# Patient Record
Sex: Male | Born: 1950 | Race: Black or African American | Hispanic: No | Smoking: Current every day smoker
Health system: Southern US, Community
[De-identification: ages and names within clinical notes are randomized; demographics above are authoritative.]

## PROBLEM LIST (undated history)

## (undated) DIAGNOSIS — M1990 Unspecified osteoarthritis, unspecified site: Secondary | ICD-10-CM

## (undated) DIAGNOSIS — E119 Type 2 diabetes mellitus without complications: Secondary | ICD-10-CM

## (undated) HISTORY — PX: BACK SURGERY: SHX140

## (undated) HISTORY — PX: SHOULDER SURGERY: SHX246

---

## 2020-03-14 ENCOUNTER — Other Ambulatory Visit: Payer: Self-pay | Admitting: Orthopedic Surgery

## 2020-03-14 DIAGNOSIS — S32010A Wedge compression fracture of first lumbar vertebra, initial encounter for closed fracture: Secondary | ICD-10-CM

## 2020-03-16 ENCOUNTER — Other Ambulatory Visit: Payer: Self-pay

## 2020-03-16 ENCOUNTER — Ambulatory Visit
Admission: RE | Admit: 2020-03-16 | Discharge: 2020-03-16 | Disposition: A | Discharge status: Home / Self Care | Payer: Medicare HMO | Source: Ambulatory Visit | Attending: Orthopedic Surgery | Admitting: Orthopedic Surgery

## 2020-03-16 DIAGNOSIS — S32010A Wedge compression fracture of first lumbar vertebra, initial encounter for closed fracture: Secondary | ICD-10-CM | POA: Insufficient documentation

## 2020-03-17 ENCOUNTER — Other Ambulatory Visit
Admission: RE | Admit: 2020-03-17 | Discharge: 2020-03-17 | Disposition: A | Discharge status: Home / Self Care | Payer: Medicare HMO | Source: Ambulatory Visit | Attending: Orthopedic Surgery | Admitting: Orthopedic Surgery

## 2020-03-17 ENCOUNTER — Other Ambulatory Visit: Payer: Self-pay | Admitting: Orthopedic Surgery

## 2020-03-17 DIAGNOSIS — Z01812 Encounter for preprocedural laboratory examination: Secondary | ICD-10-CM | POA: Insufficient documentation

## 2020-03-17 DIAGNOSIS — Z20822 Contact with and (suspected) exposure to covid-19: Secondary | ICD-10-CM | POA: Insufficient documentation

## 2020-03-17 LAB — SARS CORONAVIRUS 2 (TAT 6-24 HRS): SARS Coronavirus 2: NEGATIVE

## 2020-03-18 ENCOUNTER — Ambulatory Visit: Payer: Medicare HMO | Admitting: Anesthesiology

## 2020-03-18 ENCOUNTER — Encounter
Admission: RE | Disposition: A | Discharge status: Home / Self Care | Payer: Self-pay | Source: Home / Self Care | Attending: Orthopedic Surgery

## 2020-03-18 ENCOUNTER — Ambulatory Visit
Admission: RE | Admit: 2020-03-18 | Discharge: 2020-03-18 | Disposition: A | Discharge status: Home / Self Care | Payer: Medicare HMO | Attending: Orthopedic Surgery | Admitting: Orthopedic Surgery

## 2020-03-18 ENCOUNTER — Ambulatory Visit: Payer: Medicare HMO

## 2020-03-18 ENCOUNTER — Other Ambulatory Visit: Payer: Self-pay

## 2020-03-18 ENCOUNTER — Encounter: Payer: Self-pay | Admitting: Orthopedic Surgery

## 2020-03-18 DIAGNOSIS — Z7984 Long term (current) use of oral hypoglycemic drugs: Secondary | ICD-10-CM | POA: Insufficient documentation

## 2020-03-18 DIAGNOSIS — Z8616 Personal history of COVID-19: Secondary | ICD-10-CM | POA: Insufficient documentation

## 2020-03-18 DIAGNOSIS — F17200 Nicotine dependence, unspecified, uncomplicated: Secondary | ICD-10-CM | POA: Insufficient documentation

## 2020-03-18 DIAGNOSIS — Z419 Encounter for procedure for purposes other than remedying health state, unspecified: Secondary | ICD-10-CM

## 2020-03-18 DIAGNOSIS — S32010A Wedge compression fracture of first lumbar vertebra, initial encounter for closed fracture: Secondary | ICD-10-CM | POA: Insufficient documentation

## 2020-03-18 HISTORY — DX: Type 2 diabetes mellitus without complications: E11.9

## 2020-03-18 HISTORY — DX: Unspecified osteoarthritis, unspecified site: M19.90

## 2020-03-18 HISTORY — PX: KYPHOPLASTY: SHX5884

## 2020-03-18 LAB — GLUCOSE, CAPILLARY
Glucose-Capillary: 112 mg/dL — ABNORMAL HIGH (ref 70–99)
Glucose-Capillary: 105 mg/dL — ABNORMAL HIGH (ref 70–99)

## 2020-03-18 SURGERY — KYPHOPLASTY
Anesthesia: Monitor Anesthesia Care

## 2020-03-18 MED ORDER — FENTANYL CITRATE (PF) 100 MCG/2ML IJ SOLN
INTRAMUSCULAR | Status: AC
  Filled 2020-03-18: qty 2

## 2020-03-18 MED ORDER — CHLORHEXIDINE GLUCONATE 0.12 % MT SOLN: 15.0000 mL | Freq: Once | OROMUCOSAL | Status: AC

## 2020-03-18 MED ORDER — ONDANSETRON HCL 4 MG/2ML IJ SOLN
INTRAMUSCULAR | Status: AC
  Filled 2020-03-18: qty 2

## 2020-03-18 MED ORDER — ONDANSETRON HCL 4 MG PO TABS: 4.0000 mg | ORAL_TABLET | Freq: Four times a day (QID) | ORAL | Status: DC | PRN

## 2020-03-18 MED ORDER — ONDANSETRON HCL 4 MG/2ML IJ SOLN
INTRAMUSCULAR | Status: DC | PRN
  Administered 2020-03-18: 4 mg via INTRAVENOUS

## 2020-03-18 MED ORDER — CHLORHEXIDINE GLUCONATE 0.12 % MT SOLN
OROMUCOSAL | Status: AC
  Administered 2020-03-18: 15 mL via OROMUCOSAL
  Filled 2020-03-18: qty 15

## 2020-03-18 MED ORDER — HYDROCODONE-ACETAMINOPHEN 7.5-325 MG PO TABS: 1.0000 | ORAL_TABLET | Freq: Four times a day (QID) | ORAL | 0 refills | Status: AC | PRN

## 2020-03-18 MED ORDER — PROPOFOL 500 MG/50ML IV EMUL
INTRAVENOUS | Status: DC | PRN
  Administered 2020-03-18: 35 ug/kg/min via INTRAVENOUS

## 2020-03-18 MED ORDER — DEXTROSE 5 % IV SOLN
3.0000 g | INTRAVENOUS | Status: AC
  Administered 2020-03-18: 3 g via INTRAVENOUS
  Filled 2020-03-18: qty 3

## 2020-03-18 MED ORDER — LIDOCAINE HCL (PF) 2 % IJ SOLN
INTRAMUSCULAR | Status: AC
  Filled 2020-03-18: qty 5

## 2020-03-18 MED ORDER — LIDOCAINE HCL (PF) 1 % IJ SOLN
INTRAMUSCULAR | Status: AC
  Filled 2020-03-18: qty 60

## 2020-03-18 MED ORDER — EPINEPHRINE PF 1 MG/ML IJ SOLN
INTRAMUSCULAR | Status: AC
  Filled 2020-03-18: qty 1

## 2020-03-18 MED ORDER — ORAL CARE MOUTH RINSE: 15.0000 mL | Freq: Once | OROMUCOSAL | Status: AC

## 2020-03-18 MED ORDER — MIDAZOLAM HCL 2 MG/2ML IJ SOLN
INTRAMUSCULAR | Status: DC | PRN
  Administered 2020-03-18: 1 mg via INTRAVENOUS

## 2020-03-18 MED ORDER — SODIUM CHLORIDE 0.9 % IV SOLN: INTRAVENOUS | Status: DC

## 2020-03-18 MED ORDER — ONDANSETRON HCL 4 MG/2ML IJ SOLN: 4.0000 mg | Freq: Four times a day (QID) | INTRAMUSCULAR | Status: DC | PRN

## 2020-03-18 MED ORDER — FENTANYL CITRATE (PF) 100 MCG/2ML IJ SOLN
INTRAMUSCULAR | Status: AC
  Administered 2020-03-18: 25 ug via INTRAVENOUS
  Filled 2020-03-18: qty 2

## 2020-03-18 MED ORDER — ONDANSETRON HCL 4 MG/2ML IJ SOLN: 4.0000 mg | Freq: Once | INTRAMUSCULAR | Status: DC | PRN

## 2020-03-18 MED ORDER — METOCLOPRAMIDE HCL 5 MG/ML IJ SOLN: 5.0000 mg | Freq: Three times a day (TID) | INTRAMUSCULAR | Status: DC | PRN

## 2020-03-18 MED ORDER — BUPIVACAINE HCL (PF) 0.5 % IJ SOLN
INTRAMUSCULAR | Status: AC
  Filled 2020-03-18: qty 30

## 2020-03-18 MED ORDER — METOCLOPRAMIDE HCL 10 MG PO TABS: 5.0000 mg | ORAL_TABLET | Freq: Three times a day (TID) | ORAL | Status: DC | PRN

## 2020-03-18 MED ORDER — MIDAZOLAM HCL 2 MG/2ML IJ SOLN
INTRAMUSCULAR | Status: AC
  Filled 2020-03-18: qty 2

## 2020-03-18 MED ORDER — FENTANYL CITRATE (PF) 100 MCG/2ML IJ SOLN
INTRAMUSCULAR | Status: DC | PRN
  Administered 2020-03-18 (×4): 25 ug via INTRAVENOUS

## 2020-03-18 MED ORDER — FENTANYL CITRATE (PF) 100 MCG/2ML IJ SOLN
25.0000 ug | INTRAMUSCULAR | Status: DC | PRN
  Administered 2020-03-18 (×2): 25 ug via INTRAVENOUS

## 2020-03-18 SURGICAL SUPPLY — 21 items
ADH SKN CLS APL DERMABOND .7 (GAUZE/BANDAGES/DRESSINGS) ×1
CEMENT KYPHON CX01A KIT/MIXER (Cement) ×2 IMPLANT
COVER WAND RF STERILE (DRAPES) ×2 IMPLANT
DERMABOND ADVANCED (GAUZE/BANDAGES/DRESSINGS) ×1
DERMABOND ADVANCED .7 DNX12 (GAUZE/BANDAGES/DRESSINGS) ×1 IMPLANT
DEVICE BIOPSY BONE KYPHX (INSTRUMENTS) ×2 IMPLANT
DRAPE C-ARM XRAY 36X54 (DRAPES) ×2 IMPLANT
DURAPREP 26ML APPLICATOR (WOUND CARE) ×2 IMPLANT
GLOVE SURG SYN 9.0  PF PI (GLOVE) ×1
GLOVE SURG SYN 9.0 PF PI (GLOVE) ×1 IMPLANT
GOWN SRG 2XL LVL 4 RGLN SLV (GOWNS) ×1 IMPLANT
GOWN STRL NON-REIN 2XL LVL4 (GOWNS) ×2
GOWN STRL REUS W/ TWL LRG LVL3 (GOWN DISPOSABLE) ×1 IMPLANT
GOWN STRL REUS W/TWL LRG LVL3 (GOWN DISPOSABLE) ×2
MANIFOLD NEPTUNE II (INSTRUMENTS) ×2 IMPLANT
PACK KYPHOPLASTY (MISCELLANEOUS) ×2 IMPLANT
RENTAL RFA GENERATOR (MISCELLANEOUS) IMPLANT
STRAP SAFETY 5IN WIDE (MISCELLANEOUS) ×2 IMPLANT
SWABSTK COMLB BENZOIN TINCTURE (MISCELLANEOUS) ×2 IMPLANT
TRAY KYPHOPAK 15/3 EXPRESS 1ST (MISCELLANEOUS) IMPLANT
TRAY KYPHOPAK 20/3 EXPRESS 1ST (MISCELLANEOUS) ×2 IMPLANT

## 2020-03-18 NOTE — Op Note (Signed)
03/18/2020  2:26 PM  PATIENT:  Corey Buchanan   MRN: [id number]   PRE-OPERATIVE DIAGNOSIS:  closed wedge compression fracture of L1   POST-OPERATIVE DIAGNOSIS:  closed wedge compression fracture of L1   PROCEDURE:  Procedure(s): KYPHOPLASTY L1  SURGEON: Laurene Footman, MD   ASSISTANTS: None   ANESTHESIA:   local and MAC   EBL:  No intake/output data recorded.   BLOOD ADMINISTERED:none   DRAINS: none    LOCAL MEDICATIONS USED:  MARCAINE    and XYLOCAINE    SPECIMEN:   L1 vertebral body biopsy   DISPOSITION OF SPECIMEN:  Pathology   COUNTS:  YES   TOURNIQUET:  * No tourniquets in log *   IMPLANTS: Bone cement   DICTATION: .Dragon Dictation  patient was brought to the operating room and after adequate anesthesia was obtained the patient was placed prone.  C arm was brought in in good visualization of the affected level obtained on both AP and lateral projections.  After patient identification and timeout procedures were completed, local anesthetic was infiltrated with 10 cc 1% Xylocaine infiltrated subcutaneously.  This is done the area on the each side of the planned approach.  The back was then prepped and draped in the usual sterile manner and repeat timeout procedure carried out.  A spinal needle was brought down to the pedicle on the each side of  L1 and a 50-50 mix of 1% Xylocaine half percent Sensorcaine with epinephrine total of 20 cc injected on each side.  After allowing this to set a small incision was made and the trocar was advanced into the vertebral body in an extrapedicular fashion.  Biopsy was obtained Drilling was carried out balloon inserted with inflation to  for cc on the right and across the midline so a second entry was not required.  When the cement was appropriate consistency 6-1/2 cc were injected on the right  into the vertebral body with a small amount extravasation into the superior and inferior disc spaces, good fill superior to inferior endplates and  from right to left sides along the inferior endplate.  After the cement had set the trochar was removed and permanent C-arm views obtained.  The wound was closed with Dermabond followed by [place]:  Discharged home after recovery room   PATIENT DISPOSITION:  PACU - hemodynamically stable.

## 2020-03-18 NOTE — Transfer of Care (Signed)
Immediate Anesthesia Transfer of Care Note  Patient: Corey Buchanan  Procedure(s) Performed: L1 Kyphoplasty (N/A )  Patient Location: PACU  Anesthesia Type:General  Level of Consciousness: awake, alert  and oriented  Airway & Oxygen Therapy: Patient Spontanous Breathing and Patient connected to nasal cannula oxygen  Post-op Assessment: Report given to RN and Post -op Vital signs reviewed and stable  Post vital signs: Reviewed and stable  Last Vitals:  Vitals Value Taken Time  BP    Temp    Pulse    Resp    SpO2      Last Pain:  Vitals:   03/18/20 1103  TempSrc: Temporal  PainSc: 8          Complications: No complications documented.
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
## 2020-03-18 NOTE — Anesthesia Postprocedure Evaluation (Deleted)
Anesthesia Post Note  Patient: Sindy Messing  Procedure(s) Performed: L1 Kyphoplasty (N/A )  Patient location during evaluation: PACU Anesthesia Type: General Level of consciousness: awake and alert Pain management: pain level controlled Vital Signs Assessment: post-procedure vital signs reviewed and stable Respiratory status: spontaneous breathing, nonlabored ventilation, respiratory function stable and patient connected to nasal cannula oxygen Cardiovascular status: blood pressure returned to baseline and stable Postop Assessment: no apparent nausea or vomiting Anesthetic complications: no   No complications documented.   Last Vitals:  Vitals:   03/18/20 1103  BP: (!) 151/83  Pulse: (!) 56  Resp: 16  Temp: 36.5 C  SpO2: 96%    Last Pain:  Vitals:   03/18/20 1103  TempSrc: Temporal  PainSc: Cisco Irianna Gilday

## 2020-03-18 NOTE — Discharge Instructions (Addendum)
Take it easy today and tomorrow then try to walk as much as you can. Remove Band-Aid on Thursday then okay to shower. Pain medicine as directed. Call office if you are having problems  AMBULATORY SURGERY  DISCHARGE INSTRUCTIONS   1) The drugs that you were given will stay in your system until tomorrow so for the next 24 hours you should not:  A) Drive an automobile B) Make any legal decisions C) Drink any alcoholic beverage   2) You may resume regular meals tomorrow.  Today it is better to start with liquids and gradually work up to solid foods.  You may eat anything you prefer, but it is better to start with liquids, then soup and crackers, and gradually work up to solid foods.   3) Please notify your doctor immediately if you have any unusual bleeding, trouble breathing, redness and pain at the surgery site, drainage, fever, or pain not relieved by medication.    4) Additional Instructions:  Please contact your physician with any problems or Same Day Surgery at [phone], Monday through Friday 6 am to 4 pm, or [place] at [facility] number at [phone].

## 2020-03-18 NOTE — Anesthesia Preprocedure Evaluation (Signed)
Anesthesia Evaluation  Patient identified by MRN, date of birth, ID band Patient awake    Reviewed: Allergy & Precautions, H&P , NPO status , Patient's Chart, lab work & pertinent test results, reviewed documented beta blocker date and time   Airway Mallampati: III  TM Distance: >3 FB Neck ROM: full    Dental no notable dental hx. (+) Teeth Intact   Pulmonary neg pulmonary ROS, Current Smoker,    Pulmonary exam normal breath sounds clear to auscultation       Cardiovascular Exercise Tolerance: Poor hypertension, On Medications negative cardio ROS   Rhythm:regular Rate:Normal     Neuro/Psych negative neurological ROS  negative psych ROS   GI/Hepatic negative GI ROS, Neg liver ROS,   Endo/Other  negative endocrine ROSdiabetes, Well Controlled, Type 2  Renal/GU      Musculoskeletal   Abdominal   Peds  Hematology negative hematology ROS (+)   Anesthesia Other Findings   Reproductive/Obstetrics negative OB ROS                             Anesthesia Physical Anesthesia Plan  ASA: III  Anesthesia Plan: MAC   Post-op Pain Management:    Induction:   PONV Risk Score and Plan:   Airway Management Planned:   Additional Equipment:   Intra-op Plan:   Post-operative Plan:   Informed Consent: I have reviewed the patients History and Physical, chart, labs and discussed the procedure including the risks, benefits and alternatives for the proposed anesthesia with the patient or authorized representative who has indicated his/her understanding and acceptance.       Plan Discussed with: CRNA  Anesthesia Plan Comments:         Anesthesia Quick Evaluation

## 2020-03-19 ENCOUNTER — Encounter: Payer: Self-pay | Admitting: Orthopedic Surgery

## 2020-03-20 LAB — SURGICAL PATHOLOGY

## 2020-03-25 NOTE — Anesthesia Postprocedure Evaluation (Signed)
Anesthesia Post Note  Patient: Corey Buchanan  Procedure(s) Performed: L1 Kyphoplasty (N/A )  Patient location during evaluation: PACU Anesthesia Type: MAC Level of consciousness: awake and alert Pain management: pain level controlled Vital Signs Assessment: post-procedure vital signs reviewed and stable Respiratory status: spontaneous breathing, nonlabored ventilation, respiratory function stable and patient connected to nasal cannula oxygen Cardiovascular status: blood pressure returned to baseline and stable Postop Assessment: no apparent nausea or vomiting Anesthetic complications: no   No complications documented.   Last Vitals:  Vitals:   03/18/20 1506 03/18/20 1514  BP: (!) 162/101 127/63  Pulse: 67   Resp: 16   Temp: (!) 36.3 C   SpO2: 97%     Last Pain:  Vitals:   03/19/20 0839  TempSrc:   PainSc: 0-No pain                 Molli Barrows

## 2020-03-28 ENCOUNTER — Other Ambulatory Visit: Payer: Self-pay | Admitting: Orthopedic Surgery

## 2020-03-28 ENCOUNTER — Other Ambulatory Visit (HOSPITAL_COMMUNITY): Payer: Self-pay | Admitting: Orthopedic Surgery

## 2020-03-28 DIAGNOSIS — S32050D Wedge compression fracture of fifth lumbar vertebra, subsequent encounter for fracture with routine healing: Secondary | ICD-10-CM | POA: Diagnosis not present

## 2020-03-28 DIAGNOSIS — S32010D Wedge compression fracture of first lumbar vertebra, subsequent encounter for fracture with routine healing: Secondary | ICD-10-CM | POA: Diagnosis not present

## 2020-03-28 DIAGNOSIS — Z9889 Other specified postprocedural states: Secondary | ICD-10-CM | POA: Diagnosis not present

## 2020-04-01 ENCOUNTER — Ambulatory Visit: Payer: Medicare HMO

## 2020-04-02 ENCOUNTER — Ambulatory Visit
Admission: RE | Admit: 2020-04-02 | Discharge: 2020-04-02 | Disposition: A | Discharge status: Home / Self Care | Payer: Medicare HMO | Source: Ambulatory Visit | Attending: Orthopedic Surgery | Admitting: Orthopedic Surgery

## 2020-04-02 ENCOUNTER — Other Ambulatory Visit: Payer: Self-pay

## 2020-04-02 DIAGNOSIS — M545 Low back pain, unspecified: Secondary | ICD-10-CM | POA: Diagnosis not present

## 2020-04-02 DIAGNOSIS — S32050D Wedge compression fracture of fifth lumbar vertebra, subsequent encounter for fracture with routine healing: Secondary | ICD-10-CM | POA: Diagnosis not present

## 2020-04-11 ENCOUNTER — Ambulatory Visit: Payer: Medicare HMO

## 2020-05-08 ENCOUNTER — Encounter: Payer: Self-pay | Admitting: Orthopedic Surgery

## 2020-05-08 DIAGNOSIS — S32019D Unspecified fracture of first lumbar vertebra, subsequent encounter for fracture with routine healing: Secondary | ICD-10-CM | POA: Diagnosis not present

## 2020-05-08 DIAGNOSIS — M75102 Unspecified rotator cuff tear or rupture of left shoulder, not specified as traumatic: Secondary | ICD-10-CM | POA: Diagnosis not present

## 2020-05-08 DIAGNOSIS — G8929 Other chronic pain: Secondary | ICD-10-CM | POA: Diagnosis not present

## 2020-05-08 DIAGNOSIS — I998 Other disorder of circulatory system: Secondary | ICD-10-CM | POA: Diagnosis not present

## 2020-06-09 DIAGNOSIS — S32019D Unspecified fracture of first lumbar vertebra, subsequent encounter for fracture with routine healing: Secondary | ICD-10-CM | POA: Diagnosis not present

## 2020-06-13 DIAGNOSIS — M170 Bilateral primary osteoarthritis of knee: Secondary | ICD-10-CM | POA: Diagnosis not present

## 2020-06-13 DIAGNOSIS — S32019D Unspecified fracture of first lumbar vertebra, subsequent encounter for fracture with routine healing: Secondary | ICD-10-CM | POA: Diagnosis not present

## 2020-06-13 DIAGNOSIS — G8929 Other chronic pain: Secondary | ICD-10-CM | POA: Diagnosis not present

## 2020-06-13 DIAGNOSIS — M75102 Unspecified rotator cuff tear or rupture of left shoulder, not specified as traumatic: Secondary | ICD-10-CM | POA: Diagnosis not present

## 2020-06-19 ENCOUNTER — Emergency Department: Payer: Medicare HMO

## 2020-06-19 ENCOUNTER — Encounter: Payer: Self-pay | Admitting: Emergency Medicine

## 2020-06-19 ENCOUNTER — Other Ambulatory Visit: Payer: Self-pay

## 2020-06-19 ENCOUNTER — Emergency Department
Admission: EM | Admit: 2020-06-19 | Discharge: 2020-06-19 | Disposition: A | Discharge status: Home / Self Care | Payer: Medicare HMO | Attending: Emergency Medicine | Admitting: Emergency Medicine

## 2020-06-19 DIAGNOSIS — I10 Essential (primary) hypertension: Secondary | ICD-10-CM | POA: Insufficient documentation

## 2020-06-19 DIAGNOSIS — K8020 Calculus of gallbladder without cholecystitis without obstruction: Secondary | ICD-10-CM | POA: Diagnosis not present

## 2020-06-19 DIAGNOSIS — R079 Chest pain, unspecified: Secondary | ICD-10-CM | POA: Diagnosis not present

## 2020-06-19 DIAGNOSIS — Z7984 Long term (current) use of oral hypoglycemic drugs: Secondary | ICD-10-CM | POA: Insufficient documentation

## 2020-06-19 DIAGNOSIS — F17210 Nicotine dependence, cigarettes, uncomplicated: Secondary | ICD-10-CM | POA: Insufficient documentation

## 2020-06-19 DIAGNOSIS — Z20822 Contact with and (suspected) exposure to covid-19: Secondary | ICD-10-CM | POA: Insufficient documentation

## 2020-06-19 DIAGNOSIS — Z79899 Other long term (current) drug therapy: Secondary | ICD-10-CM | POA: Insufficient documentation

## 2020-06-19 DIAGNOSIS — E119 Type 2 diabetes mellitus without complications: Secondary | ICD-10-CM | POA: Diagnosis not present

## 2020-06-19 DIAGNOSIS — I700 Atherosclerosis of aorta: Secondary | ICD-10-CM | POA: Diagnosis not present

## 2020-06-19 LAB — BASIC METABOLIC PANEL
Anion gap: 9 (ref 5–15)
BUN: 12 mg/dL (ref 8–23)
CO2: 25 mmol/L (ref 22–32)
Calcium: 9 mg/dL (ref 8.9–10.3)
Chloride: 106 mmol/L (ref 98–111)
Creatinine, Ser: 0.93 mg/dL (ref 0.61–1.24)
GFR, Estimated: >60 mL/min (ref >60)
Glucose, Bld: 114 mg/dL — ABNORMAL HIGH (ref 70–99)
Potassium: 3.4 mmol/L — ABNORMAL LOW (ref 3.5–5.1)
Sodium: 140 mmol/L (ref 135–145)

## 2020-06-19 LAB — CBC
HCT: 52 % (ref 39.0–52.0)
Hemoglobin: 17.6 g/dL — ABNORMAL HIGH (ref 13.0–17.0)
MCH: 30.8 pg (ref 26.0–34.0)
MCHC: 33.8 g/dL (ref 30.0–36.0)
MCV: 91.1 fL (ref 80.0–100.0)
Platelets: 186 10*3/uL (ref 150–400)
RBC: 5.71 MIL/uL (ref 4.22–5.81)
RDW: 14.5 % (ref 11.5–15.5)
WBC: 5.9 10*3/uL (ref 4.0–10.5)
nRBC: 0 % (ref 0.0–0.2)

## 2020-06-19 LAB — D-DIMER, QUANTITATIVE: D-Dimer, Quant: 0.76 ug/mL-FEU — ABNORMAL HIGH (ref 0.00–0.50)

## 2020-06-19 LAB — RESP PANEL BY RT-PCR (FLU A&B, COVID) ARPGX2
Influenza A by PCR: NEGATIVE
Influenza B by PCR: NEGATIVE
SARS Coronavirus 2 by RT PCR: NEGATIVE

## 2020-06-19 LAB — TROPONIN I (HIGH SENSITIVITY)
Troponin I (High Sensitivity): 17 ng/L (ref <18)
Troponin I (High Sensitivity): 15 ng/L (ref <18)

## 2020-06-19 MED ORDER — MELOXICAM 7.5 MG PO TABS
15.0000 mg | ORAL_TABLET | Freq: Once | ORAL | Status: AC
  Administered 2020-06-19: 15 mg via ORAL
  Filled 2020-06-19: qty 2

## 2020-06-19 MED ORDER — TIZANIDINE HCL 4 MG PO TABS: 4.0000 mg | ORAL_TABLET | Freq: Three times a day (TID) | ORAL | 0 refills | Status: AC

## 2020-06-19 MED ORDER — MELOXICAM 15 MG PO TABS: 15.0000 mg | ORAL_TABLET | Freq: Every day | ORAL | 0 refills | Status: AC

## 2020-06-19 MED ORDER — IOHEXOL 350 MG/ML SOLN
75.0000 mL | Freq: Once | INTRAVENOUS | Status: AC | PRN
  Administered 2020-06-19: 75 mL via INTRAVENOUS

## 2020-06-19 NOTE — ED Notes (Signed)
ED Provider at bedside. 

## 2020-06-19 NOTE — ED Triage Notes (Signed)
Pt comes into the ED via POV c/o right side chest pain that started yesterday.  Pt denies any radiating pain and denies any N/V, SHOB, or dizziness.  Pt in NAD with even and unlabored respirations at this time.  Pt denies any cardiac history.

## 2020-06-19 NOTE — ED Notes (Signed)
Patient back from CT at this time

## 2020-06-19 NOTE — Discharge Instructions (Signed)
Your work-up today is reassuring.  There is no indication that the pain in your chest is due to any heart related issues.  Your CT scan of your chest shows no blood clot in your lung or other masses or concerns.  Please take the muscle relaxer as prescribed and only if you are going to be able to rest.  Do not take it if you plan to drive or work.  You may take the meloxicam daily.  See your primary care provider or cardiologist if your pain does not go away within the next couple of days.  Return to the emergency department if your symptoms change or worsen and you are unable to schedule appointment.

## 2020-06-19 NOTE — ED Triage Notes (Signed)
Pt to ED co right sided cp x2 days with shob. Denies radiation, n/v No cardiac hx Pt in NAD, holding chest on arrival

## 2020-06-19 NOTE — ED Provider Notes (Signed)
[facility] Emergency Department Provider Note  ____________________________________________   Event Date/Time   First MD Initiated Contact with Patient 06/19/20 1652     (approximate)  I have reviewed the triage vital signs and the nursing notes.   HISTORY  Chief Complaint Chest Pain   HPI Corey Buchanan is a 70 y.o. male with a history of diabetes, hypertension presents to the emergency department for treatment and evaluation of right-sided chest pain x2 days.  Symptoms started while at rest.  No similar symptoms in the past.  No heart disease or pulmonary disease that he is aware of.  He does smoke cigarettes.  He returned from a trip to [place] prior to onset of symptoms.  Pain increases with movement and taking a deep breath.  No alleviating measures attempted prior to arrival..   Past Medical History:  Diagnosis Date  . Arthritis   . Diabetes mellitus without complication ([place])     There are no problems to display for this patient.   Past Surgical History:  Procedure Laterality Date  . BACK SURGERY    . KYPHOPLASTY N/A 03/18/2020   Procedure: L1 Kyphoplasty;  Surgeon: Hessie Knows, MD;  Location: [facility] ORS;  Service: Orthopedics;  Laterality: N/A;  . SHOULDER SURGERY     bilateral    Prior to Admission medications   Medication Sig Start Date End Date Taking? Authorizing Provider  meloxicam (MOBIC) 15 MG tablet Take 1 tablet (15 mg total) by mouth daily. 06/19/20  Yes Leianna Barga B, FNP  tiZANidine (ZANAFLEX) 4 MG tablet Take 1 tablet (4 mg total) by mouth 3 (three) times daily. 06/19/20  Yes Adrianna Dudas B, FNP  amLODipine (NORVASC) 10 MG tablet Take 10 mg by mouth daily.    [provider]  atorvastatin (LIPITOR) 40 MG tablet Take 40 mg by mouth daily. 03/08/20   [provider]  empagliflozin (JARDIANCE) 25 MG TABS tablet Take 25 mg by mouth daily.    [provider]  furosemide (LASIX) 20 MG tablet Take 20 mg by  mouth daily as needed for fluid. 02/29/20   [provider]  HYDROcodone-acetaminophen (NORCO) 7.5-325 MG tablet Take 1 tablet by mouth every 6 (six) hours as needed for moderate pain. 03/18/20   Hessie Knows, MD  lisinopril (ZESTRIL) 20 MG tablet Take 20 mg by mouth 2 (two) times daily. 02/29/20   [provider]  metFORMIN (GLUCOPHAGE) 500 MG tablet Take 500 mg by mouth 3 (three) times daily. 02/29/20   [provider]  omeprazole (PRILOSEC) 40 MG capsule Take 40 mg by mouth daily.    [provider]  oxybutynin (DITROPAN XL) 15 MG 24 hr tablet Take 15 mg by mouth daily. 02/29/20   [provider]  oxyCODONE-acetaminophen (PERCOCET) 10-325 MG tablet Take 1 tablet by mouth 5 (five) times daily. 03/01/20   [provider]  Polyethyl Glycol-Propyl Glycol (LUBRICATING EYE DROPS) 0.4-0.3 % SOLN Place 1 drop into both eyes 3 (three) times daily as needed (dry/irritated eyes.).    [provider]    Allergies Patient has no known allergies.  History reviewed. No pertinent family history.  Social History Social History   Tobacco Use  . Smoking status: Current Every Day Smoker  . Smokeless tobacco: Never Used    Review of Systems  Constitutional: No fever/chills. Eyes: No visual changes. ENT: No sore throat. Cardiovascular: Positive for chest pain.  Positive for pleuritic pain.  Negative for palpitations.  Negative for leg pain.  Respiratory: Negative for shortness of breath. Gastrointestinal: No abdominal pain.  Negative for nausea, no vomiting.  No diarrhea.  No constipation. Genitourinary: Negative for dysuria. Musculoskeletal: Negative for back pain.  Skin: Negative for rash, lesion, wound. Neurological: Negative for headaches, focal weakness or numbness. ____________________________________________   PHYSICAL EXAM:  VITAL SIGNS: ED Triage Vitals  Enc Vitals Group     BP 06/19/20 1409 122/79     Pulse Rate 06/19/20 1409 75      Resp 06/19/20 1409 18     Temp 06/19/20 1409 97.8 F (36.6 C)     Temp Source 06/19/20 1409 Oral     SpO2 06/19/20 1409 96 %     Weight 06/19/20 1415 250 lb (113.4 kg)     Height 06/19/20 1415 6\' 1"  (1.854 m)     Head Circumference --      Peak Flow --      Pain Score 06/19/20 1415 8     Pain Loc --      Pain Edu? --      Excl. in [place]? --     Constitutional: Alert and oriented.  Overall well appearing and in no acute distress.  Normal mental status. Eyes: Conjunctivae are normal. PERRL. Head: Atraumatic. Nose: No congestion/rhinnorhea. Mouth/Throat: Mucous membranes are moist.  Oropharynx non-erythematous. Tongue normal in size and color. Neck: No stridor.  No carotid bruit appreciated on exam. Hematological/Lymphatic/Immunilogical: No cervical lymphadenopathy. Cardiovascular: Normal rate, regular rhythm. Grossly normal heart sounds.  Good peripheral circulation. Respiratory: Normal respiratory effort.  No retractions. Lungs CTAB.  Pleuritic pain right anterior chest wall on deep inspiration Gastrointestinal: Soft and nontender. No distention. No abdominal bruits. No CVA tenderness. Genitourinary: Exam deferred. Musculoskeletal: No lower extremity tenderness.  No edema of extremities. Neurologic:  Normal speech and language. No gross focal neurologic deficits are appreciated. Skin:  Skin is warm, dry and intact. No rash noted. Psychiatric: Mood and affect are normal. Speech and behavior are normal.  ____________________________________________   LABS (all labs ordered are listed, but only abnormal results are displayed)  Labs Reviewed  BASIC METABOLIC PANEL - Abnormal; Notable for the following components:      Result Value   Potassium 3.4 (*)    Glucose, Bld 114 (*)    All other components within normal limits  CBC - Abnormal; Notable for the following components:   Hemoglobin 17.6 (*)    All other components within normal limits  D-DIMER, QUANTITATIVE (NOT AT [facility])  - Abnormal; Notable for the following components:   D-Dimer, Quant 0.76 (*)    All other components within normal limits  RESP PANEL BY RT-PCR (FLU A&B, COVID) ARPGX2  TROPONIN I (HIGH SENSITIVITY)  TROPONIN I (HIGH SENSITIVITY)   ____________________________________________  EKG  ED ECG REPORT I, Starlynn Klinkner, FNP-BC personally viewed and interpreted this ECG.   Date: 06/19/2020  EKG Time: 1357  Rate: 89  Rhythm: normal sinus rhythm, occasional PVC noted, unifocal  Axis: Rightward  Intervals:none  ST&T Change: No ST elevation  ____________________________________________  RADIOLOGY  ED MD interpretation: Chest x-ray negative for acute cardiopulmonary abnormality.  I, Sherrie George, personally viewed and evaluated these images (plain radiographs) as part of my medical decision making, as well as reviewing the written report by the radiologist.  Official radiology report(s): DG Chest 2 View  Result Date: 06/19/2020 CLINICAL DATA:  Chest pain. EXAM: CHEST - 2 VIEW COMPARISON:  None. FINDINGS: The heart size and mediastinal contours are within normal limits. Both lungs are  clear. The visualized skeletal structures are unremarkable. IMPRESSION: No active cardiopulmonary disease. Electronically Signed   By: Marijo Conception M.D.   On: 06/19/2020 14:54   CT Angio Chest PE W and/or Wo Contrast  Result Date: 06/19/2020 CLINICAL DATA:  70 year old male with concern for pulmonary embolism. EXAM: CT ANGIOGRAPHY CHEST WITH CONTRAST TECHNIQUE: Multidetector CT imaging of the chest was performed using the standard protocol during bolus administration of intravenous contrast. Multiplanar CT image reconstructions and MIPs were obtained to evaluate the vascular anatomy. CONTRAST:  62mL OMNIPAQUE IOHEXOL 350 MG/ML SOLN COMPARISON:  Chest radiograph dated 06/19/2020. FINDINGS: Cardiovascular: Top-normal cardiac size. No pericardial effusion. There is coronary vascular calcification. Mild  atherosclerotic calcification of the thoracic aorta. No aneurysmal dilatation. Evaluation of the pulmonary arteries is limited due to respiratory motion artifact. No pulmonary artery embolus identified. Mediastinum/Nodes: No hilar or mediastinal adenopathy. The esophagus and the thyroid gland are grossly unremarkable. No mediastinal fluid collection. Lungs/Pleura: Minimal bibasilar dependent atelectasis/scarring. The lungs are otherwise clear. There is no pleural effusion pneumothorax. The central airways are patent. Upper Abdomen: Noncalcified gallstones. Musculoskeletal: Osteopenia with degenerative changes of the spine. Partially visualized old compression fracture of L1 with vertebroplasty. There is nondisplaced fracture of the T12 spinous process, age indeterminate, likely subacute or old. No definite acute osseous pathology. Review of the MIP images confirms the above findings. IMPRESSION: 1. No acute intrathoracic pathology. No CT evidence of pulmonary embolism. 2. Cholelithiasis. 3. Aortic Atherosclerosis (ICD10-I70.0). Electronically Signed   By: Anner Crete M.D.   On: 06/19/2020 18:59    ____________________________________________   PROCEDURES  Procedure(s) performed: None  Procedures  Critical Care performed: No  ____________________________________________   INITIAL IMPRESSION / ASSESSMENT AND PLAN   70 year old male presenting to the emergency department for treatment and evaluation of right side anterior chest wall pain that started yesterday while at rest.  See HPI for details.  Cardiac work-up started while awaiting ER room assignment.  Initial labs including the first troponin is reassuring as is his EKG.   Differential diagnosis includes, but not limited to:  Differential includes, but is not limited to, viral syndrome, bronchitis including COPD exacerbation, reactive airway disease including asthma, CHF including exacerbation with or without pulmonary/interstitial  edema, pneumothorax, ACS, thoracic trauma, and pulmonary embolism, ACS, aortic dissection, pulmonary embolism, cardiac tamponade, pneumothorax, pneumonia, pericarditis, myocarditis, GI-related causes including esophagitis/gastritis, and musculoskeletal chest wall pain.    ED COURSE  Patient's pain is pleuritic, he does smoke cigarettes, and he recently returned from a several hour car ride. Will get a D-dimer and COVID/influenza.  ----------------------------------------- 7:33 PM on 06/19/2020 -----------------------------------------  CTA for PE is negative. He will be discharged home with zanaflex and mobic prescriptions. He will be advised to follow up with primary care provider and schedule an evaluation by cardiology. He was advised to return to the ER for symptoms that change or worsen if unable to schedule an appointment.     FINAL CLINICAL IMPRESSION(S) / ED DIAGNOSES  Final diagnoses:  Nonspecific chest pain     ED Discharge Orders         Ordered    tiZANidine (ZANAFLEX) 4 MG tablet  3 times daily        06/19/20 1926    meloxicam (MOBIC) 15 MG tablet  Daily        06/19/20 1926           Corey Buchanan was evaluated in Emergency Department on 06/19/2020 for the symptoms described in the history  of present illness. He was evaluated in the context of the global COVID-19 pandemic, which necessitated consideration that the patient might be at risk for infection with the SARS-CoV-2 virus that causes COVID-19. Institutional protocols and algorithms that pertain to the evaluation of patients at risk for COVID-19 are in a state of rapid change based on information released by regulatory bodies including the CDC and federal and state organizations. These policies and algorithms were followed during the patient's care in the ED.   Note:  This document was prepared using Dragon voice recognition software and may include unintentional dictation errors.   Victorino Dike,  FNP 06/19/20 1934    Duffy Bruce, MD 06/20/20 1500

## 2020-06-19 NOTE — ED Notes (Signed)
Patient transported to CT 

## 2020-06-19 NOTE — ED Provider Notes (Signed)
EKG: Normal sinus rhythm, reticulate 89.  PR 150, QRS 88, QTc 452.  No acute ST elevations or Vesprin acute evidence of acute ischemia or infarct.   Duffy Bruce, MD 06/19/20 1910

## 2020-07-08 DIAGNOSIS — E119 Type 2 diabetes mellitus without complications: Secondary | ICD-10-CM | POA: Diagnosis not present

## 2020-07-08 DIAGNOSIS — C61 Malignant neoplasm of prostate: Secondary | ICD-10-CM | POA: Diagnosis not present

## 2020-07-08 DIAGNOSIS — M170 Bilateral primary osteoarthritis of knee: Secondary | ICD-10-CM | POA: Diagnosis not present

## 2020-07-08 DIAGNOSIS — E785 Hyperlipidemia, unspecified: Secondary | ICD-10-CM | POA: Diagnosis not present

## 2020-07-08 DIAGNOSIS — R634 Abnormal weight loss: Secondary | ICD-10-CM | POA: Diagnosis not present

## 2020-07-08 DIAGNOSIS — Z760 Encounter for issue of repeat prescription: Secondary | ICD-10-CM | POA: Diagnosis not present

## 2020-07-08 DIAGNOSIS — G8929 Other chronic pain: Secondary | ICD-10-CM | POA: Diagnosis not present

## 2020-10-13 ENCOUNTER — Ambulatory Visit (LOCAL_COMMUNITY_HEALTH_CENTER): Payer: Self-pay

## 2020-10-13 ENCOUNTER — Other Ambulatory Visit: Payer: Self-pay

## 2020-10-13 DIAGNOSIS — Z111 Encounter for screening for respiratory tuberculosis: Secondary | ICD-10-CM

## 2020-10-16 ENCOUNTER — Other Ambulatory Visit: Payer: Self-pay

## 2020-10-16 ENCOUNTER — Ambulatory Visit (LOCAL_COMMUNITY_HEALTH_CENTER): Payer: Medicare Other

## 2020-10-16 DIAGNOSIS — Z111 Encounter for screening for respiratory tuberculosis: Secondary | ICD-10-CM

## 2020-10-16 LAB — TB SKIN TEST
Induration: 0 mm
TB Skin Test: NEGATIVE

## 2020-10-31 ENCOUNTER — Other Ambulatory Visit: Payer: Self-pay

## 2020-10-31 ENCOUNTER — Ambulatory Visit (LOCAL_COMMUNITY_HEALTH_CENTER): Payer: Self-pay

## 2020-10-31 DIAGNOSIS — Z111 Encounter for screening for respiratory tuberculosis: Secondary | ICD-10-CM

## 2020-11-03 ENCOUNTER — Ambulatory Visit (LOCAL_COMMUNITY_HEALTH_CENTER): Payer: Self-pay

## 2020-11-03 ENCOUNTER — Other Ambulatory Visit: Payer: Self-pay

## 2020-11-03 DIAGNOSIS — Z111 Encounter for screening for respiratory tuberculosis: Secondary | ICD-10-CM

## 2020-11-03 LAB — TB SKIN TEST
Induration: 0 mm
TB Skin Test: NEGATIVE

## 2023-05-03 IMAGING — CR DG CHEST 2V
2 series · 2 of 2 positions shown · non-contrast
Comparison: None.

CLINICAL DATA: Chest pain.

EXAM:
CHEST - 2 VIEW

[chest pa]
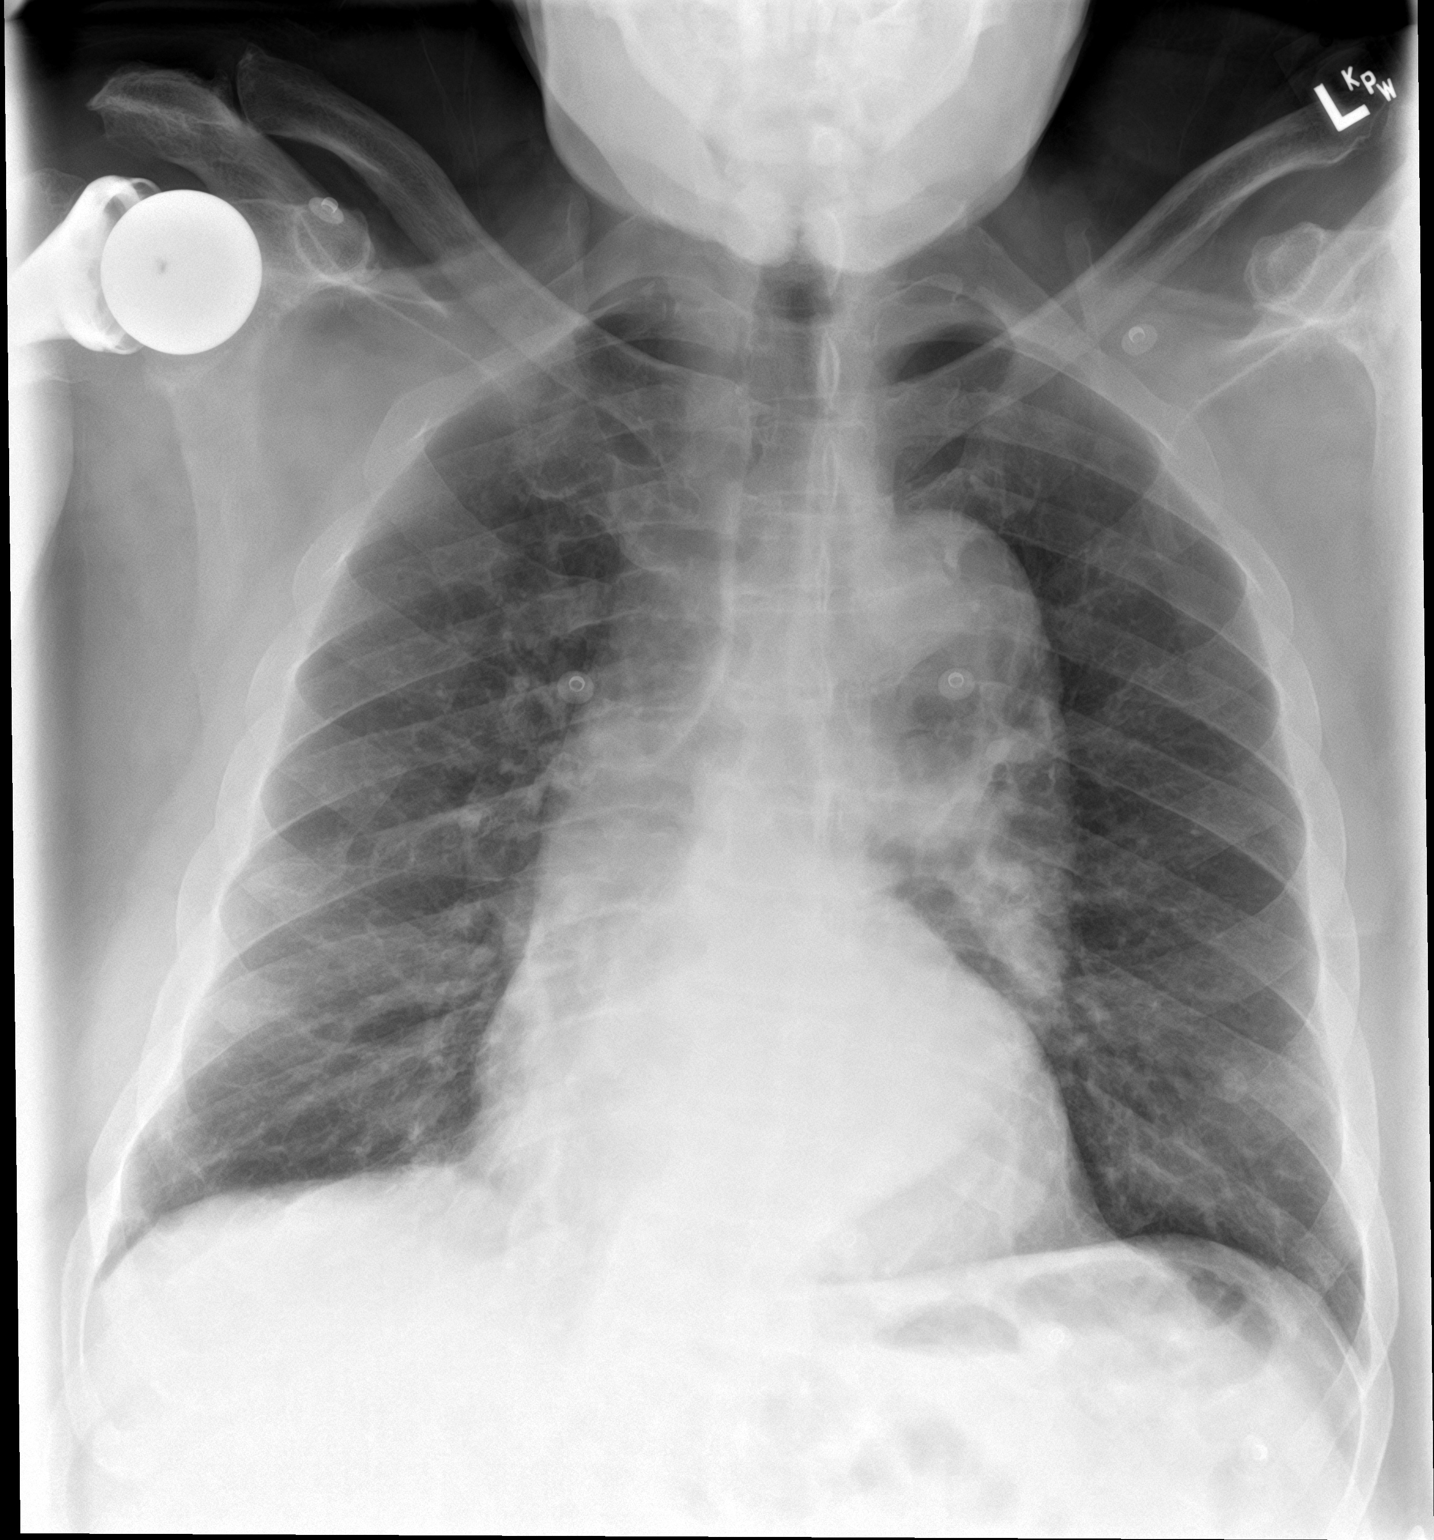

[chest lat]
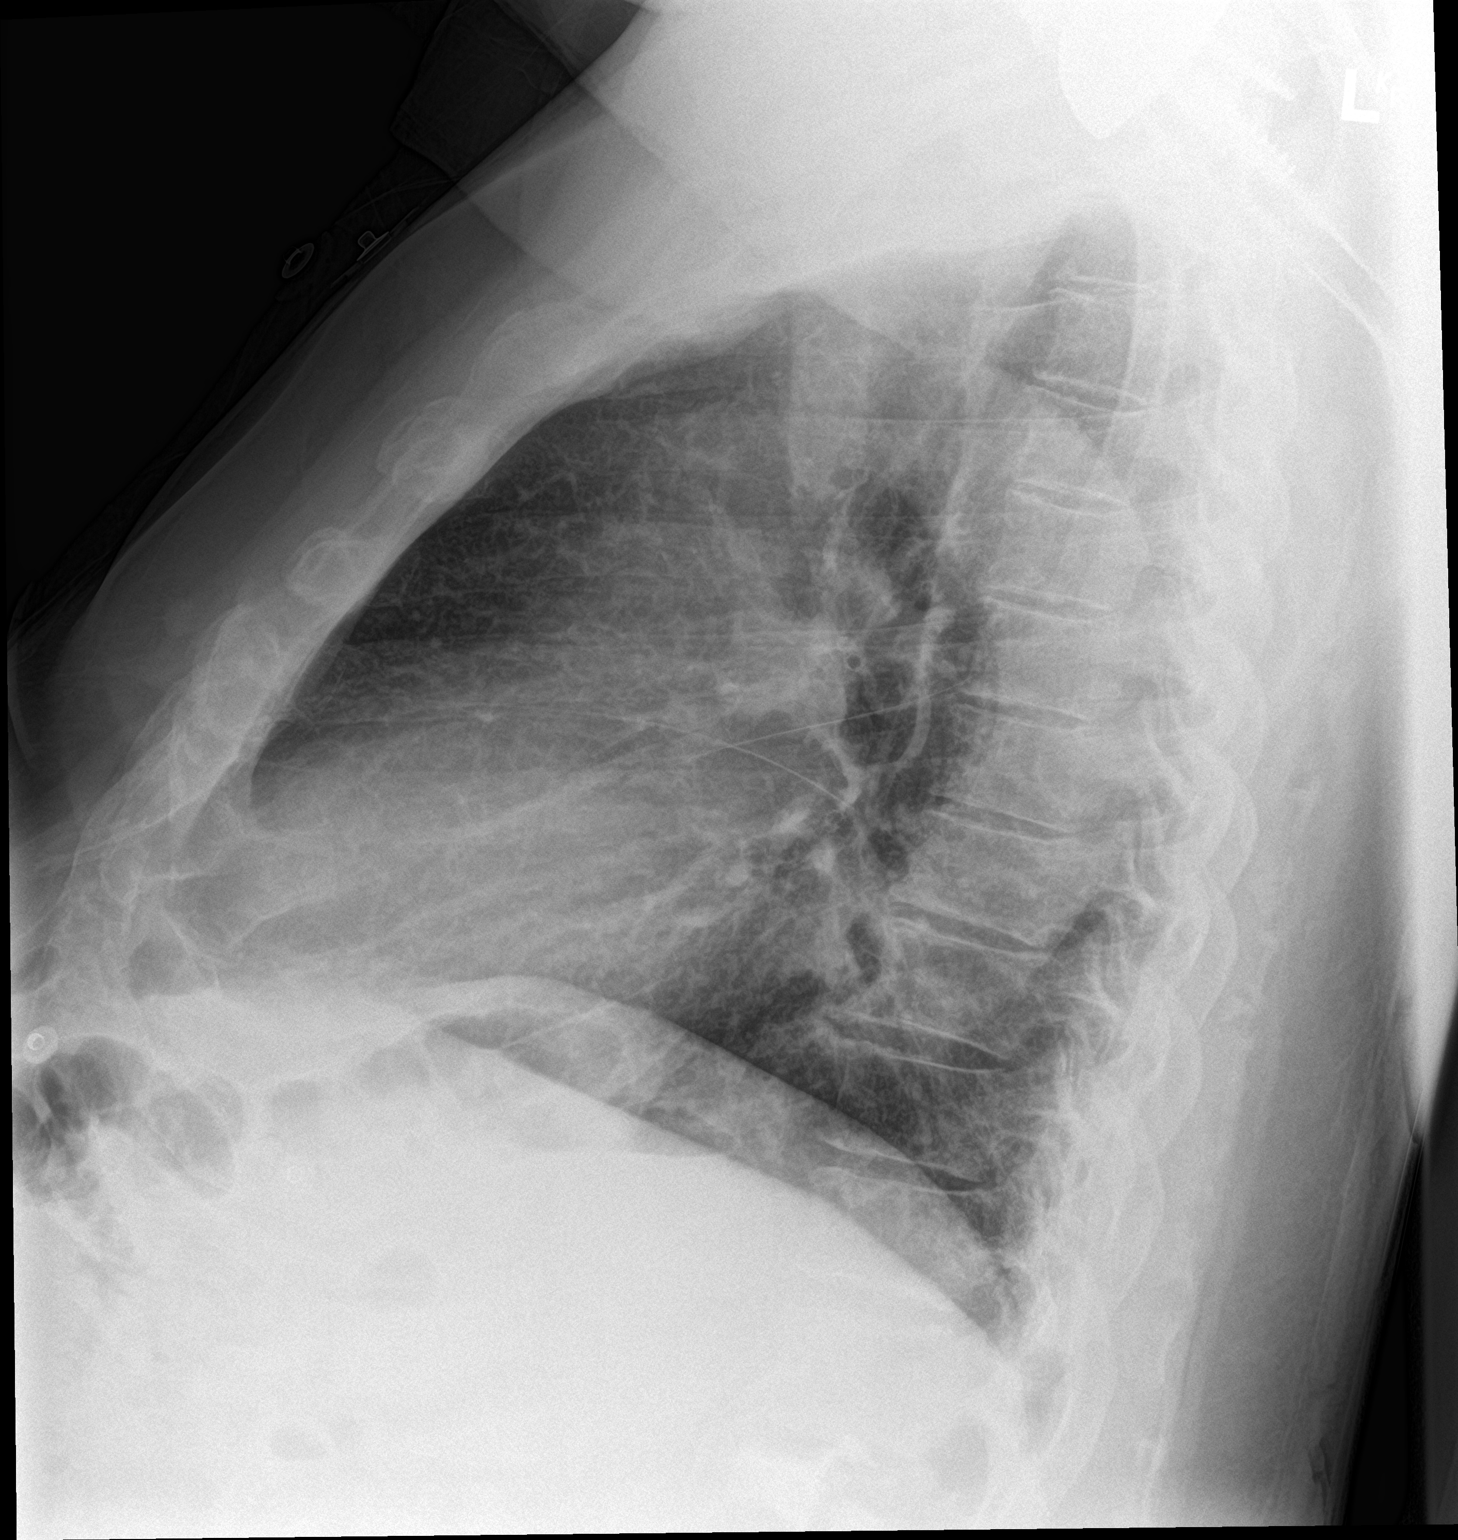

[2 of 2 positions shown; findings below may reference images not displayed]

FINDINGS: The heart size and mediastinal contours are within normal limits.
Both lungs are clear. The visualized skeletal structures are
unremarkable.
IMPRESSION: No active cardiopulmonary disease.
